# Patient Record
Sex: Female | Born: 1995 | Race: Black or African American | Hispanic: No | Marital: Single | Smoking: Never smoker
Health system: Southern US, Community
[De-identification: ages and names within clinical notes are randomized; demographics above are authoritative.]

## PROBLEM LIST (undated history)

## (undated) DIAGNOSIS — Z789 Other specified health status: Secondary | ICD-10-CM

## (undated) HISTORY — PX: NO PAST SURGERIES: SHX2092

---

## 2016-08-06 ENCOUNTER — Inpatient Hospital Stay (HOSPITAL_COMMUNITY)
Admission: AD | Admit: 2016-08-06 | Discharge: 2016-08-06 | Disposition: A | Discharge status: Home / Self Care | Payer: Managed Care, Other (non HMO) | Source: Ambulatory Visit | Attending: Obstetrics and Gynecology | Admitting: Obstetrics and Gynecology

## 2016-08-06 ENCOUNTER — Encounter (HOSPITAL_COMMUNITY): Payer: Self-pay

## 2016-08-06 DIAGNOSIS — Z975 Presence of (intrauterine) contraceptive device: Secondary | ICD-10-CM | POA: Diagnosis not present

## 2016-08-06 DIAGNOSIS — Z3202 Encounter for pregnancy test, result negative: Secondary | ICD-10-CM | POA: Diagnosis not present

## 2016-08-06 DIAGNOSIS — R109 Unspecified abdominal pain: Secondary | ICD-10-CM | POA: Diagnosis present

## 2016-08-06 DIAGNOSIS — R1030 Lower abdominal pain, unspecified: Secondary | ICD-10-CM

## 2016-08-06 HISTORY — DX: Other specified health status: Z78.9

## 2016-08-06 LAB — WET PREP, GENITAL
Sperm: NONE SEEN
TRICH WET PREP: NONE SEEN
Yeast Wet Prep HPF POC: NONE SEEN

## 2016-08-06 LAB — URINALYSIS, ROUTINE W REFLEX MICROSCOPIC
Bilirubin Urine: NEGATIVE
Glucose, UA: NEGATIVE mg/dL
Hgb urine dipstick: NEGATIVE
Ketones, ur: NEGATIVE mg/dL
LEUKOCYTES UA: NEGATIVE
Nitrite: NEGATIVE
PROTEIN: NEGATIVE mg/dL
SPECIFIC GRAVITY, URINE: 1.02 (ref 1.005–1.030)
pH: 6.5 (ref 5.0–8.0)

## 2016-08-06 LAB — POCT PREGNANCY, URINE: PREG TEST UR: NEGATIVE

## 2016-08-06 MED ORDER — METRONIDAZOLE 500 MG PO TABS: 500.0000 mg | ORAL_TABLET | Freq: Two times a day (BID) | ORAL | 0 refills | Status: AC

## 2016-08-06 NOTE — MAU Note (Signed)
Pt presents complaining of lower abdominal pain and vaginal bleeding that started tonight with clots. States she has a Mirena that was placed 2 years ago and missed her yearly appointment in September. Has had periods the last 3 months that are sometimes heavy. States it is not time for a period now. LMP 07/26/16. Last intercourse 3 days ago. Has not tried any medication for the pain.

## 2016-08-06 NOTE — Discharge Instructions (Signed)

## 2016-08-06 NOTE — MAU Provider Note (Signed)
MAU HISTORY AND PHYSICAL  Chief Complaint:  Abdominal Pain   Leslie Andreaniya Feely is a 20 y.o.  Not currently pregnant presenting for Abdominal Pain  Has had an IUD for past 2 years. For past 3 months has been having monthly light bleeding like a period. Tonight after using toilet she noticed "blood clots" that scared her and prompted her to come in. The material was dark brown. She also has some lower abdominal pain that is sharp and comes and goes. She rates it as a 5/10 at its worst. She does not have any abdominal pain tonight. She is sexually active with 1 partner, does not use protection, does not want STD testing at this time. Denies discharge.   Past Medical History:  Diagnosis Date  . Medical history non-contributory     Past Surgical History:  Procedure Laterality Date  . NO PAST SURGERIES      History reviewed. No pertinent family history.  Social History  Substance Use Topics  . Smoking status: Never Smoker  . Smokeless tobacco: Never Used  . Alcohol use No    No Known Allergies  No prescriptions prior to admission.   Review of Systems - Negative except for what is mentioned in HPI.  Physical Exam  Blood pressure 130/79, pulse 82, temperature 98 F (36.7 C), temperature source Oral, resp. rate 18, last menstrual period 07/26/2016. GENERAL: Well-developed, well-nourished female in no acute distress.  LUNGS: No respiratory distress HEART: Regular rate ABDOMEN: Soft, nontender, nondistended EXTREMITIES: Nontender, no edema, 2+ distal pulses. GU: IUD strings visible, moderate amount of yellow-white mucous present   Labs: Results for orders placed or performed during the hospital encounter of 08/06/16 (from the past 24 hour(s))  Urinalysis, Routine w reflex microscopic (not [facility])   Collection Time: 08/06/16  9:00 PM  Result Value Ref Range   Color, Urine YELLOW YELLOW   APPearance CLEAR CLEAR   Specific Gravity, Urine 1.020 1.005 - 1.030   pH 6.5 5.0 - 8.0   Glucose, UA NEGATIVE NEGATIVE mg/dL   Hgb urine dipstick NEGATIVE NEGATIVE   Bilirubin Urine NEGATIVE NEGATIVE   Ketones, ur NEGATIVE NEGATIVE mg/dL   Protein, ur NEGATIVE NEGATIVE mg/dL   Nitrite NEGATIVE NEGATIVE   Leukocytes, UA NEGATIVE NEGATIVE  Pregnancy, urine POC   Collection Time: 08/06/16  9:12 PM  Result Value Ref Range   Preg Test, Ur NEGATIVE NEGATIVE    Imaging Studies:  No results found.  Assessment: Leslie Andreaniya Minner is  20 y.o. No obstetric history on file. at Unknown presents with Abdominal Pain . MDM UA Pregnancy test Wet prep  Plan:  Lower abdominal pain and spotting with IUD in place- IUD Strings visible on speculum exam Wet prep positive for BV, prescription provided for Flagyl Outpatient US ordered to ascertain location of IUD Plan of care reviewed with patient, including labs and tests ordered and medical treatment.  Tillman SersAngela C Riccio, DO PGY-1 10/26/201710:43 PM  CNM attestation:  I have seen and examined this patient; I agree with above documentation in the Resident's note.   Leslie Andreaniya Heying is a 20 y.o. No obstetric history on file. reporting lower abdominal pain, vaginal spotting.  PE: BP 122/64   Pulse 77   Temp 98 F (36.7 C) (Oral)   Resp 18   LMP 07/26/2016 (Exact Date)  Gen: calm comfortable, NAD Resp: normal effort, no distress Abd: NT  ROS, labs, PMH reviewed   Plan: DC home US outpatient   Tawnya CrookHogan, Sheily Lineman Donovan, CNM 11:23 PM

## 2016-08-11 ENCOUNTER — Inpatient Hospital Stay (HOSPITAL_COMMUNITY)
Admission: AD | Admit: 2016-08-11 | Discharge: 2016-08-12 | Disposition: A | Discharge status: Home / Self Care | Payer: Managed Care, Other (non HMO) | Source: Ambulatory Visit | Attending: Family Medicine | Admitting: Family Medicine

## 2016-08-11 ENCOUNTER — Encounter (HOSPITAL_COMMUNITY): Payer: Self-pay

## 2016-08-11 ENCOUNTER — Inpatient Hospital Stay (HOSPITAL_COMMUNITY): Payer: Managed Care, Other (non HMO)

## 2016-08-11 DIAGNOSIS — R110 Nausea: Secondary | ICD-10-CM | POA: Diagnosis not present

## 2016-08-11 DIAGNOSIS — R1030 Lower abdominal pain, unspecified: Secondary | ICD-10-CM | POA: Insufficient documentation

## 2016-08-11 DIAGNOSIS — R109 Unspecified abdominal pain: Secondary | ICD-10-CM

## 2016-08-11 LAB — POCT PREGNANCY, URINE: Preg Test, Ur: NEGATIVE

## 2016-08-11 LAB — URINALYSIS, ROUTINE W REFLEX MICROSCOPIC
BILIRUBIN URINE: NEGATIVE
GLUCOSE, UA: NEGATIVE mg/dL
HGB URINE DIPSTICK: NEGATIVE
Ketones, ur: 15 mg/dL — AB
Leukocytes, UA: NEGATIVE
Nitrite: NEGATIVE
Protein, ur: NEGATIVE mg/dL
pH: 6 (ref 5.0–8.0)

## 2016-08-11 LAB — CBC
HEMATOCRIT: 40.2 % (ref 36.0–46.0)
HEMOGLOBIN: 13.7 g/dL (ref 12.0–15.0)
MCH: 30.4 pg (ref 26.0–34.0)
MCHC: 34.1 g/dL (ref 30.0–36.0)
MCV: 89.3 fL (ref 78.0–100.0)
Platelets: 261 10*3/uL (ref 150–400)
RBC: 4.5 MIL/uL (ref 3.87–5.11)
RDW: 12.1 % (ref 11.5–15.5)
WBC: 7.5 10*3/uL (ref 4.0–10.5)

## 2016-08-11 MED ORDER — KETOROLAC TROMETHAMINE 60 MG/2ML IM SOLN
60.0000 mg | Freq: Once | INTRAMUSCULAR | Status: AC
Start: 2016-08-11 — End: 2016-08-11
  Administered 2016-08-11: 60 mg via INTRAMUSCULAR
  Filled 2016-08-11: qty 2

## 2016-08-11 MED ORDER — IBUPROFEN 600 MG PO TABS: 600.0000 mg | ORAL_TABLET | Freq: Four times a day (QID) | ORAL | 0 refills | Status: AC | PRN

## 2016-08-11 MED ORDER — PROMETHAZINE HCL 25 MG PO TABS: 12.5000 mg | ORAL_TABLET | Freq: Four times a day (QID) | ORAL | 0 refills | Status: DC | PRN

## 2016-08-11 MED ORDER — ONDANSETRON 8 MG PO TBDP
8.0000 mg | ORAL_TABLET | Freq: Once | ORAL | Status: AC
  Administered 2016-08-11: 8 mg via ORAL
  Filled 2016-08-11: qty 1

## 2016-08-11 NOTE — MAU Note (Signed)
Pt reports she has been seen x 2 recently for abd pain and the pain is getting worse. Reports nausea and vomiting. Denies dysuria.

## 2016-08-11 NOTE — MAU Provider Note (Signed)
  History     CSN: [id number]  Arrival date and time: 08/11/16 2003   None     No chief complaint on file.  HPI Pt is 20 y.o.No obstetric history on file. Not pregnant with IUD in place for 3 months.  Pt was seen recently on 10/26 for evaluation of abdominal pain with outpatient ultrasound scheduled on 11/2.  Pt has been out of work for 1 week b/c of symptoms.  Pt wakes up in the mornings nauseated and has abd pain, mostly in the mornings.  Pt stays in bed most of the day because she feels weak.  Pt is taking Flagyl for BV.  Pt has not taken anything for the pain because what she gets over the counter does not work and she was not given a prescription for anything when she was last here. RN note:   [] Hide copied text [] Hover for attribution information Pt reports she has been seen x 2 recently for abd pain and the pain is getting worse. Reports nausea and vomiting. Denies dysuria.     Note from 08/06/2016 Jeanette Bryant is a 20 y.o.  Not currently pregnant presenting for Abdominal Pain  Has had an IUD for past 2 years. For past 3 months has been having monthly light bleeding like a period. Tonight after using toilet she noticed "blood clots" that scared her and prompted her to come in. The material was dark brown. She also has some lower abdominal pain that is sharp and comes and goes. She rates it as a 5/10 at its worst. She does not have any abdominal pain tonight. She is sexually active with 1 partner, does not use protection, does not want STD testing at this time. Denies discharge  Past Medical History:  Diagnosis Date  . Medical history non-contributory     Past Surgical History:  Procedure Laterality Date  . NO PAST SURGERIES      No family history on file.  Social History  Substance Use Topics  . Smoking status: Never Smoker  . Smokeless tobacco: Never Used  . Alcohol use No    Allergies: No Known Allergies  Prescriptions Prior to Admission  Medication Sig  Dispense Refill Last Dose  . metroNIDAZOLE (FLAGYL) 500 MG tablet Take 1 tablet (500 mg total) by mouth 2 (two) times daily. 14 tablet 0     Review of Systems  Constitutional: Positive for malaise/fatigue. Negative for chills and fever.  Gastrointestinal: Positive for abdominal pain, nausea and vomiting. Negative for constipation and diarrhea.  Neurological: Positive for weakness.   Physical Exam   Blood pressure 130/79, pulse 75, temperature 98.9 F (37.2 C), temperature source Oral, resp. rate 16, height 5\' 7"  (1.702 m), weight 226 lb (102.5 kg), last menstrual period 07/26/2016, SpO2 98 %.  Physical Exam  MAU Course  Procedures Toradol 60mg  IM for pain Zofran 8mg  ODT Urine testing for GC/Chlamydia US ordered Reviewed but care handed over to [facility] Leftwich-Kirby CNM before I evaluated her    Assessment and Plan    LINEBERRY,SUSAN 08/11/2016, 8:46 PM

## 2016-08-11 NOTE — Discharge Instructions (Signed)

## 2016-08-11 NOTE — MAU Provider Note (Signed)
Chief Complaint: No chief complaint on file.   None     SUBJECTIVE HPI: Jeanette Bryant is a 20 y.o. G0  Not pregnant who presents to maternity admissions reporting abdominal pain she was evaluated for on 10/26 persists and is associated with nausea without vomiting. The pain is intermittent, cramping pain in her low abdomen.  She reports onset of vaginal bleeding on the same day the pain started, 5 days ago. She has a Mirena IUD and does not usually have heavy bleeding so was worried something was wrong. Her vaginal bleeding resolved without treatment. She reports the pain is mostly in the mornings making her feel weak. She has missed work x 1 week because of her symptoms. She reports OTC medications are not working.   She denies vaginal itching/burning, urinary symptoms, h/a, dizziness, or fever/chills.     HPI  Past Medical History:  Diagnosis Date  . Medical history non-contributory    Past Surgical History:  Procedure Laterality Date  . NO PAST SURGERIES     Social History   Social History  . Marital status: Single    Spouse name: N/A  . Number of children: N/A  . Years of education: N/A   Occupational History  . Not on file.   Social History Main Topics  . Smoking status: Never Smoker  . Smokeless tobacco: Never Used  . Alcohol use No  . Drug use: No  . Sexual activity: Yes    Birth control/ protection: IUD   Other Topics Concern  . Not on file   Social History Narrative  . No narrative on file   No current facility-administered medications on file prior to encounter.    Current Outpatient Prescriptions on File Prior to Encounter  Medication Sig Dispense Refill  . metroNIDAZOLE (FLAGYL) 500 MG tablet Take 1 tablet (500 mg total) by mouth 2 (two) times daily. 14 tablet 0   No Known Allergies  ROS:  Review of Systems  Constitutional: Negative for chills, fatigue and fever.  Respiratory: Negative for shortness of breath.   Cardiovascular: Negative for chest  pain.  Gastrointestinal: Positive for abdominal pain.  Genitourinary: Positive for pelvic pain. Negative for difficulty urinating, dysuria, flank pain, vaginal bleeding, vaginal discharge and vaginal pain.  Neurological: Negative for dizziness and headaches.  Psychiatric/Behavioral: Negative.      I have reviewed patient's Past Medical Hx, Surgical Hx, Family Hx, Social Hx, medications and allergies.   Physical Exam   Patient Vitals for the past 24 hrs:  BP Temp Temp src Pulse Resp SpO2 Height Weight  08/11/16 2033 130/79 98.9 F (37.2 C) Oral 75 16 98 % 5\' 7"  (1.702 m) 226 lb (102.5 kg)   Constitutional: Well-developed, well-nourished female in no acute distress.  Cardiovascular: normal rate Respiratory: normal effort GI: Abd soft, non-tender. Pos BS x 4 MS: Extremities nontender, no edema, normal ROM Neurologic: Alert and oriented x 4.  GU: Neg CVAT.  PELVIC EXAM: Deferred  LAB RESULTS Results for orders placed or performed during the hospital encounter of 08/11/16 (from the past 24 hour(s))  Urinalysis, Routine w reflex microscopic (not at [facility])     Status: Abnormal   Collection Time: 08/11/16  8:41 PM  Result Value Ref Range   Color, Urine YELLOW YELLOW   APPearance CLEAR CLEAR   Specific Gravity, Urine >1.030 (H) 1.005 - 1.030   pH 6.0 5.0 - 8.0   Glucose, UA NEGATIVE NEGATIVE mg/dL   Hgb urine dipstick NEGATIVE NEGATIVE   Bilirubin  Urine NEGATIVE NEGATIVE   Ketones, ur 15 (A) NEGATIVE mg/dL   Protein, ur NEGATIVE NEGATIVE mg/dL   Nitrite NEGATIVE NEGATIVE   Leukocytes, UA NEGATIVE NEGATIVE  Pregnancy, urine POC     Status: None   Collection Time: 08/11/16  8:43 PM  Result Value Ref Range   Preg Test, Ur NEGATIVE NEGATIVE  CBC     Status: None   Collection Time: 08/11/16  8:50 PM  Result Value Ref Range   WBC 7.5 4.0 - 10.5 K/uL   RBC 4.50 3.87 - 5.11 MIL/uL   Hemoglobin 13.7 12.0 - 15.0 g/dL   HCT 46.940.2 62.936.0 - 52.846.0 %   MCV 89.3 78.0 - 100.0 fL   MCH 30.4  26.0 - 34.0 pg   MCHC 34.1 30.0 - 36.0 g/dL   RDW 41.312.1 24.411.5 - 01.015.5 %   Platelets 261 150 - 400 K/uL       IMAGING [place] Transvaginal Non-ob  Result Date: 08/11/2016 CLINICAL DATA:  Pelvic and abdominal pain for 6 days, IUD for 3 months EXAM: TRANSABDOMINAL AND TRANSVAGINAL ULTRASOUND OF PELVIS TECHNIQUE: Both transabdominal and transvaginal ultrasound examinations of the pelvis were performed. Transabdominal technique was performed for global imaging of the pelvis including uterus, ovaries, adnexal regions, and pelvic cul-de-sac. It was necessary to proceed with endovaginal exam following the transabdominal exam to visualize the uterus endometrium and ovaries. COMPARISON:  None FINDINGS: Uterus Measurements: 6.9 x 3.2 x 4.4 cm. No fibroids or other mass visualized. IUD is within the lower body to lower uterine segment. Endometrium Thickness: 1.2 mm.  No focal abnormality visualized. Right ovary Measurements: 3.3 x 2.5 x 2.3 cm. Normal appearance/no adnexal mass Left ovary Measurements: 3.3 x 1.3 x 1.6 cm. Normal appearance/no adnexal mass. Other findings Small amount of anechoic free pelvic fluid IMPRESSION: 1. Mildly low-lying intrauterine device which appears position within the mid to lower uterine body and lower uterine segment. 2. Small amount of free pelvic fluid. 3. Otherwise unremarkable pelvic ultrasound Electronically Signed   By: Jasmine PangKim  Fujinaga M.D.   On: 08/11/2016 22:08   [place] Pelvis Complete  Result Date: 08/11/2016 CLINICAL DATA:  Pelvic and abdominal pain for 6 days, IUD for 3 months EXAM: TRANSABDOMINAL AND TRANSVAGINAL ULTRASOUND OF PELVIS TECHNIQUE: Both transabdominal and transvaginal ultrasound examinations of the pelvis were performed. Transabdominal technique was performed for global imaging of the pelvis including uterus, ovaries, adnexal regions, and pelvic cul-de-sac. It was necessary to proceed with endovaginal exam following the transabdominal exam to visualize the uterus  endometrium and ovaries. COMPARISON:  None FINDINGS: Uterus Measurements: 6.9 x 3.2 x 4.4 cm. No fibroids or other mass visualized. IUD is within the lower body to lower uterine segment. Endometrium Thickness: 1.2 mm.  No focal abnormality visualized. Right ovary Measurements: 3.3 x 2.5 x 2.3 cm. Normal appearance/no adnexal mass Left ovary Measurements: 3.3 x 1.3 x 1.6 cm. Normal appearance/no adnexal mass. Other findings Small amount of anechoic free pelvic fluid IMPRESSION: 1. Mildly low-lying intrauterine device which appears position within the mid to lower uterine body and lower uterine segment. 2. Small amount of free pelvic fluid. 3. Otherwise unremarkable pelvic ultrasound Electronically Signed   By: Jasmine PangKim  Fujinaga M.D.   On: 08/11/2016 22:08    MAU Management/MDM: Ordered labs and reviewed results.  IUD in place, no evidence of acute abdomen or infection.  Reassurance provided to pt. [facility]  Encouraged pt to keep appt. Return to MAU for emergencies.  Rx for ibuprofen and Phenergan PO.  Pt stable at time of discharge.  ASSESSMENT 1. Abdominal pain in female patient   2. Abdominal pain   3. Nausea without vomiting     PLAN Discharge home   Medication List    TAKE these medications   ibuprofen 600 MG tablet Commonly known as:  ADVIL,MOTRIN Take 1 tablet (600 mg total) by mouth every 6 (six) hours as needed.   metroNIDAZOLE 500 MG tablet Commonly known as:  FLAGYL Take 1 tablet (500 mg total) by mouth 2 (two) times daily.   promethazine 25 MG tablet Commonly known as:  PHENERGAN Take 0.5-1 tablets (12.5-25 mg total) by mouth every 6 (six) hours as needed for nausea.      Follow-up Information    [facility] .   Specialty:  Obstetrics and Gynecology Why:  Or Gyn provider of your choice if symptoms persist. Return to MAU as needed for emergencies. Contact information: [address] [place] [address] [phone]          Sharen Counter Certified Nurse-Midwife 08/12/2016  12:00 AM

## 2016-08-12 LAB — HIV ANTIBODY (ROUTINE TESTING W REFLEX): HIV Screen 4th Generation wRfx: NONREACTIVE

## 2016-08-20 ENCOUNTER — Encounter (HOSPITAL_COMMUNITY): Payer: Self-pay | Admitting: Emergency Medicine

## 2016-08-20 ENCOUNTER — Emergency Department (HOSPITAL_COMMUNITY)
Admission: EM | Admit: 2016-08-20 | Discharge: 2016-08-20 | Disposition: A | Discharge status: Home / Self Care | Payer: Managed Care, Other (non HMO) | Attending: Emergency Medicine | Admitting: Emergency Medicine

## 2016-08-20 DIAGNOSIS — E860 Dehydration: Secondary | ICD-10-CM | POA: Diagnosis not present

## 2016-08-20 DIAGNOSIS — R112 Nausea with vomiting, unspecified: Secondary | ICD-10-CM | POA: Diagnosis not present

## 2016-08-20 DIAGNOSIS — R52 Pain, unspecified: Secondary | ICD-10-CM | POA: Diagnosis present

## 2016-08-20 LAB — CBC
HEMATOCRIT: 40.7 % (ref 36.0–46.0)
Hemoglobin: 13.6 g/dL (ref 12.0–15.0)
MCH: 30 pg (ref 26.0–34.0)
MCHC: 33.4 g/dL (ref 30.0–36.0)
MCV: 89.8 fL (ref 78.0–100.0)
PLATELETS: 271 10*3/uL (ref 150–400)
RBC: 4.53 MIL/uL (ref 3.87–5.11)
RDW: 11.9 % (ref 11.5–15.5)
WBC: 7.6 10*3/uL (ref 4.0–10.5)

## 2016-08-20 LAB — COMPREHENSIVE METABOLIC PANEL
ALBUMIN: 4.3 g/dL (ref 3.5–5.0)
ALT: 18 U/L (ref 14–54)
AST: 17 U/L (ref 15–41)
Alkaline Phosphatase: 47 U/L (ref 38–126)
Anion gap: 10 (ref 5–15)
BUN: 8 mg/dL (ref 6–20)
CO2: 24 mmol/L (ref 22–32)
CREATININE: 0.77 mg/dL (ref 0.44–1.00)
Calcium: 9.5 mg/dL (ref 8.9–10.3)
Chloride: 105 mmol/L (ref 101–111)
GFR calc Af Amer: >60 mL/min (ref >60)
GFR calc non Af Amer: >60 mL/min (ref >60)
GLUCOSE: 98 mg/dL (ref 65–99)
POTASSIUM: 4 mmol/L (ref 3.5–5.1)
SODIUM: 139 mmol/L (ref 135–145)
Total Bilirubin: 1 mg/dL (ref 0.3–1.2)
Total Protein: 7.8 g/dL (ref 6.5–8.1)

## 2016-08-20 LAB — URINALYSIS, ROUTINE W REFLEX MICROSCOPIC
GLUCOSE, UA: NEGATIVE mg/dL
HGB URINE DIPSTICK: NEGATIVE
KETONES UR: 40 mg/dL — AB
Leukocytes, UA: NEGATIVE
Nitrite: NEGATIVE
PH: 6 (ref 5.0–8.0)
PROTEIN: NEGATIVE mg/dL
Specific Gravity, Urine: 1.031 — ABNORMAL HIGH (ref 1.005–1.030)

## 2016-08-20 LAB — I-STAT BETA HCG BLOOD, ED (MC, WL, AP ONLY): I-stat hCG, quantitative: <5 m[IU]/mL (ref <5)

## 2016-08-20 LAB — LIPASE, BLOOD: LIPASE: 31 U/L (ref 11–51)

## 2016-08-20 MED ORDER — ONDANSETRON HCL 4 MG PO TABS: 4.0000 mg | ORAL_TABLET | Freq: Four times a day (QID) | ORAL | 0 refills | Status: DC

## 2016-08-20 MED ORDER — ONDANSETRON HCL 4 MG/2ML IJ SOLN
4.0000 mg | Freq: Once | INTRAMUSCULAR | Status: AC
  Administered 2016-08-20: 4 mg via INTRAVENOUS
  Filled 2016-08-20: qty 2

## 2016-08-20 MED ORDER — RANITIDINE HCL 150 MG PO CAPS: 150.0000 mg | ORAL_CAPSULE | Freq: Every day | ORAL | 0 refills | Status: DC

## 2016-08-20 MED ORDER — KETOROLAC TROMETHAMINE 30 MG/ML IJ SOLN
30.0000 mg | Freq: Once | INTRAMUSCULAR | Status: AC
  Administered 2016-08-20: 30 mg via INTRAVENOUS
  Filled 2016-08-20: qty 1

## 2016-08-20 MED ORDER — SODIUM CHLORIDE 0.9 % IV BOLUS (SEPSIS)
1000.0000 mL | Freq: Once | INTRAVENOUS | Status: AC
  Administered 2016-08-20: 1000 mL via INTRAVENOUS

## 2016-08-20 NOTE — ED Triage Notes (Signed)
Pt from home with c/o emesis and weakness since her birthday.  Pt denies diarrhea.  Pt additionally reports dizziness.  NAD, A&O.

## 2016-08-20 NOTE — ED Notes (Signed)
NAD. Pt ambulatory at discharge with no questions. Pt. Verbalized need to follow up with [facility].

## 2016-08-20 NOTE — ED Provider Notes (Signed)
[facility]-EM[facility]ENCY DEPT Provider Note   CSN: [id number] Arrival date & time: 08/20/16  1203     History   Chief Complaint Chief Complaint  Patient presents with  . Emesis  . Generalized Body Aches    HPI Jeanette Bryant is a 20 y.o. female.  Patient presents with two weeks of generalized body aches, fatigue, poor appetite, and vomiting after eating. Initially presented with lower abdominal pain but this has now rseolved. Saw her gynecologist who did a pelvic ultrasound which was unremarkable. Denies recent fevers, sick contacts, recent travel. Denies diarrhea or constipation. No known family history of any similar illnesses.   The history is provided by the patient and medical records. No language interpreter was used.  Emesis   This is a new problem. The current episode started more than 1 week ago. The problem occurs 2 to 4 times per day. The problem has not changed since onset.The emesis has an appearance of stomach contents. There has been no fever. Associated symptoms include abdominal pain and myalgias. Pertinent negatives include no diarrhea, no fever and no sweats.    Past Medical History:  Diagnosis Date  . Medical history non-contributory     There are no active problems to display for this patient.   Past Surgical History:  Procedure Laterality Date  . NO PAST SURGERIES      OB History    No data available       Home Medications    Prior to Admission medications   Medication Sig Start Date End Date Taking? Authorizing Provider  ibuprofen (ADVIL,MOTRIN) 600 MG tablet Take 1 tablet (600 mg total) by mouth every 6 (six) hours as needed. Patient not taking: Reported on 08/20/2016 08/11/16   Misty StanleyLisa A Leftwich-Kirby, CNM  ondansetron (ZOFRAN) 4 MG tablet Take 1 tablet (4 mg total) by mouth every 6 (six) hours. 08/20/16   Preston FleetingAnna Donovyn Guidice, MD  promethazine (PHENERGAN) 25 MG tablet Take 0.5-1 tablets (12.5-25 mg total) by mouth every 6 (six) hours as needed for  nausea. Patient not taking: Reported on 08/20/2016 08/11/16   Misty StanleyLisa A Leftwich-Kirby, CNM  ranitidine (ZANTAC) 150 MG capsule Take 1 capsule (150 mg total) by mouth daily. 08/20/16   Preston FleetingAnna Idaly Verret, MD    Family History History reviewed. No pertinent family history.  Social History Social History  Substance Use Topics  . Smoking status: Never Smoker  . Smokeless tobacco: Never Used  . Alcohol use No     Allergies   Patient has no known allergies.   Review of Systems Review of Systems  Constitutional: Positive for appetite change and fatigue. Negative for fever.  HENT: Negative.   Respiratory: Negative.   Cardiovascular: Negative.   Gastrointestinal: Positive for abdominal pain and vomiting. Negative for diarrhea.  Genitourinary: Positive for decreased urine volume.  Musculoskeletal: Positive for myalgias.  Skin: Negative.   Allergic/Immunologic: Negative for immunocompromised state.  Neurological: Negative.   Hematological: Does not bruise/bleed easily.  Psychiatric/Behavioral: Negative.      Physical Exam Updated Vital Signs BP 115/66 (BP Location: Right Arm)   Pulse 92   Temp 98.3 F (36.8 C) (Oral)   Resp 19   Wt 99.8 kg   LMP 07/26/2016 (Exact Date)   SpO2 99%   BMI 34.46 kg/m   Physical Exam  Constitutional: She is oriented to person, place, and time. She appears well-developed and well-nourished. No distress.  HENT:  Head: Normocephalic and atraumatic.  Eyes: Conjunctivae and EOM are normal. Pupils are equal, round, and  reactive to light. No scleral icterus.  Neck: Normal range of motion. Neck supple.  Cardiovascular: Normal rate, regular rhythm, normal heart sounds and intact distal pulses.  Exam reveals no gallop and no friction rub.   No murmur heard. Pulmonary/Chest: Effort normal and breath sounds normal. No respiratory distress. She has no wheezes. She has no rales.  Abdominal: Soft. She exhibits no distension. There is no tenderness. There is no  rebound and no guarding.  Musculoskeletal: She exhibits no edema.  Neurological: She is alert and oriented to person, place, and time.  Skin: Skin is warm and dry. She is not diaphoretic.  Psychiatric: She has a normal mood and affect. Her behavior is normal. Judgment and thought content normal.     ED Treatments / Results  Labs (all labs ordered are listed, but only abnormal results are displayed) Labs Reviewed  URINALYSIS, ROUTINE W REFLEX MICROSCOPIC (NOT AT [facility]) - Abnormal; Notable for the following:       Result Value   Color, Urine AMBER (*)    Specific Gravity, Urine 1.031 (*)    Bilirubin Urine SMALL (*)    Ketones, ur 40 (*)    All other components within normal limits  LIPASE, BLOOD  COMPREHENSIVE METABOLIC PANEL  CBC  I-STAT BETA HCG BLOOD, ED ([facility], [facility], [facility] ONLY)    EKG  EKG Interpretation None       Radiology No results found.  Procedures Procedures (including critical care time)  Medications Ordered in ED Medications  sodium chloride 0.9 % bolus 1,000 mL (0 mLs Intravenous Stopped 08/20/16 1858)  ondansetron (ZOFRAN) injection 4 mg (4 mg Intravenous Given 08/20/16 1643)  ketorolac (TORADOL) 30 MG/ML injection 30 mg (30 mg Intravenous Given 08/20/16 1643)     Initial Impression / Assessment and Plan / ED Course  I have reviewed the triage vital signs and the nursing notes.  Pertinent labs & imaging results that were available during my care of the patient were reviewed by me and considered in my medical decision making (see chart for details).  Clinical Course     Patient presents with two weeks of generalized body aches, vomiting after eating, intermittent abdominal pain. She has been seen by gynecology as it started with pelvic pain and she had a normal pelvic ultrasound. Now has persistent poor appetite and general fatigue. Do not suspect infection given no known source or contacts, no fever, normal white count. Unlikely to be flu. No known tick bite  exposure. Abdomen is soft and non-tender. Labs are reassuring with normal renal function, electrolytes, and hemoglobin. Pregnancy test is negative. UA suggests mild dehydration without signs of UTI or pyelonephritis. Symptoms may be exacerbated by gastritis so she will be prescribed Zantac and Zofran. I do not feel that the patient has any emergent pathology causing her symptoms and emphasized that she will need to follow up with PCP for further evaluation if symptoms continue. [facility] was [facility]. She was given return precautions for worsening symptoms and expressed understanding of these. She is in good condition for discharge home.  Final Clinical Impressions(s) / ED Diagnoses   Final diagnoses:  Dehydration  Non-intractable vomiting with nausea, unspecified vomiting type    New Prescriptions Discharge Medication List as of 08/20/2016  6:07 PM    START taking these medications   Details  ondansetron (ZOFRAN) 4 MG tablet Take 1 tablet (4 mg total) by mouth every 6 (six) hours., Starting Thu 08/20/2016, Print  ranitidine (ZANTAC) 150 MG capsule Take 1 capsule (150 mg total) by mouth daily., Starting Thu 08/20/2016, Print         Preston FleetingAnna Ismahan Lippman, MD 08/21/16 [id number]    Gwyneth SproutWhitney Plunkett, MD 08/21/16 1800

## 2016-08-20 NOTE — ED Notes (Signed)
Per PA pt. Fluids need to be completed before discharge. 150 mL remaining.

## 2017-07-04 ENCOUNTER — Emergency Department (HOSPITAL_COMMUNITY)
Admission: EM | Admit: 2017-07-04 | Discharge: 2017-07-04 | Discharge status: Against Medical Advice | Payer: Managed Care, Other (non HMO)

## 2017-07-04 NOTE — ED Notes (Signed)
Pt called from lobby with no response. 

## 2017-07-04 NOTE — ED Notes (Signed)
Pt called from the lobby with no response x3 

## 2018-12-05 ENCOUNTER — Other Ambulatory Visit: Payer: Self-pay

## 2018-12-05 ENCOUNTER — Encounter (HOSPITAL_BASED_OUTPATIENT_CLINIC_OR_DEPARTMENT_OTHER): Payer: Self-pay | Admitting: *Deleted

## 2018-12-05 ENCOUNTER — Emergency Department (HOSPITAL_BASED_OUTPATIENT_CLINIC_OR_DEPARTMENT_OTHER): Payer: No Typology Code available for payment source

## 2018-12-05 ENCOUNTER — Emergency Department (HOSPITAL_BASED_OUTPATIENT_CLINIC_OR_DEPARTMENT_OTHER)
Admission: EM | Admit: 2018-12-05 | Discharge: 2018-12-05 | Disposition: A | Discharge status: Home / Self Care | Payer: No Typology Code available for payment source | Attending: Emergency Medicine | Admitting: Emergency Medicine

## 2018-12-05 DIAGNOSIS — R079 Chest pain, unspecified: Secondary | ICD-10-CM | POA: Diagnosis present

## 2018-12-05 DIAGNOSIS — Z79899 Other long term (current) drug therapy: Secondary | ICD-10-CM | POA: Insufficient documentation

## 2018-12-05 DIAGNOSIS — R42 Dizziness and giddiness: Secondary | ICD-10-CM | POA: Insufficient documentation

## 2018-12-05 DIAGNOSIS — R110 Nausea: Secondary | ICD-10-CM | POA: Insufficient documentation

## 2018-12-05 LAB — BASIC METABOLIC PANEL
Anion gap: 5 (ref 5–15)
BUN: 13 mg/dL (ref 6–20)
CALCIUM: 8.9 mg/dL (ref 8.9–10.3)
CO2: 26 mmol/L (ref 22–32)
CREATININE: 0.68 mg/dL (ref 0.44–1.00)
Chloride: 106 mmol/L (ref 98–111)
GFR calc Af Amer: >60 mL/min (ref >60)
GFR calc non Af Amer: >60 mL/min (ref >60)
GLUCOSE: 92 mg/dL (ref 70–99)
Potassium: 4.1 mmol/L (ref 3.5–5.1)
Sodium: 137 mmol/L (ref 135–145)

## 2018-12-05 LAB — CBC WITH DIFFERENTIAL/PLATELET
Abs Immature Granulocytes: 0.01 10*3/uL (ref 0.00–0.07)
BASOS ABS: 0 10*3/uL (ref 0.0–0.1)
Basophils Relative: 0 %
EOS ABS: 0.1 10*3/uL (ref 0.0–0.5)
EOS PCT: 1 %
HEMATOCRIT: 40.6 % (ref 36.0–46.0)
Hemoglobin: 12.8 g/dL (ref 12.0–15.0)
Immature Granulocytes: 0 %
LYMPHS ABS: 2 10*3/uL (ref 0.7–4.0)
Lymphocytes Relative: 29 %
MCH: 30.4 pg (ref 26.0–34.0)
MCHC: 31.5 g/dL (ref 30.0–36.0)
MCV: 96.4 fL (ref 80.0–100.0)
Monocytes Absolute: 0.5 10*3/uL (ref 0.1–1.0)
Monocytes Relative: 7 %
NRBC: 0 % (ref 0.0–0.2)
Neutro Abs: 4.3 10*3/uL (ref 1.7–7.7)
Neutrophils Relative %: 63 %
Platelets: 262 10*3/uL (ref 150–400)
RBC: 4.21 MIL/uL (ref 3.87–5.11)
RDW: 12.3 % (ref 11.5–15.5)
WBC: 6.9 10*3/uL (ref 4.0–10.5)

## 2018-12-05 LAB — TROPONIN I

## 2018-12-05 LAB — PREGNANCY, URINE: Preg Test, Ur: NEGATIVE

## 2018-12-05 MED ORDER — SODIUM CHLORIDE 0.9 % IV BOLUS
1000.0000 mL | Freq: Once | INTRAVENOUS | Status: AC
Start: 2018-12-05 — End: 2018-12-05
  Administered 2018-12-05: 1000 mL via INTRAVENOUS

## 2018-12-05 MED ORDER — ONDANSETRON HCL 4 MG/2ML IJ SOLN
4.0000 mg | Freq: Once | INTRAMUSCULAR | Status: AC
  Administered 2018-12-05: 4 mg via INTRAVENOUS
  Filled 2018-12-05: qty 2

## 2018-12-05 NOTE — ED Provider Notes (Signed)
[facility] EMERGENCY DEPARTMENT Provider Note   CSN: [id number] Arrival date & time: 12/05/18  1020    History   Chief Complaint Chief Complaint  Patient presents with  . Chest Pain    HPI Jeanette Bryant is a 23 y.o. female otherwise healthy female presenting to the emergency department today with chief complaint of chest pain.  Patient states the pain started last night while she was talking on the phone.  She describes the pain as pressure and it is located in the center of her chest radiating to both sides.  She rates the pain 6 out of 10 in severity.  After the pain started the patient got in bed x3 hours with chest pain before falling asleep.  Patient states when she woke up this morning the chest pain was gone but returned while she was driving to work.  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 Patient reports associated nausea and dizziness. She did not take any medications prior to arrival.   Patient denies family history of cardiac disease.  Also denies shortness of breath, abdominal pain, back pain, palpitations, radiation to left/right arm, diaphoresis, syncope, recent surgery, calf pain. History provided by patient.     Past Medical History:  Diagnosis Date  . Medical history non-contributory     There are no active problems to display for this patient.   Past Surgical History:  Procedure Laterality Date  . NO PAST SURGERIES       OB History   No obstetric history on file.      Home Medications    Prior to Admission medications   Medication Sig Start Date End Date Taking? Authorizing Provider  ibuprofen (ADVIL,MOTRIN) 600 MG tablet Take 1 tablet (600 mg total) by mouth every 6 (six) hours as needed. Patient not taking: Reported on 08/20/2016 08/11/16   Leftwich-Kirby, Misty Stanley A, CNM  ondansetron (ZOFRAN) 4 MG tablet Take 1 tablet (4 mg total) by mouth every 6 (six) hours. 08/20/16   Preston Fleeting, MD    promethazine (PHENERGAN) 25 MG tablet Take 0.5-1 tablets (12.5-25 mg total) by mouth every 6 (six) hours as needed for nausea. Patient not taking: Reported on 08/20/2016 08/11/16   Leftwich-Kirby, Wilmer Floor, CNM  ranitidine (ZANTAC) 150 MG capsule Take 1 capsule (150 mg total) by mouth daily. 08/20/16   Preston Fleeting, MD    Family History History reviewed. No pertinent family history.  Social History Social History   Tobacco Use  . Smoking status: Never Smoker  . Smokeless tobacco: Never Used  Substance Use Topics  . Alcohol use: No  . Drug use: No     Allergies   Patient has no known allergies.   Review of Systems Review of Systems  Cardiovascular: Positive for chest pain.  Neurological: Positive for dizziness.  All other systems reviewed and are negative.    Physical Exam Updated Vital Signs BP 115/72   Pulse 70   Resp (!) 22   Ht 5\' 8"  (1.727 m)   Wt 104.3 kg   SpO2 100%   BMI 34.97 kg/m   Physical Exam Vitals signs and nursing note reviewed.  Constitutional:      Appearance: She is not ill-appearing or toxic-appearing.  HENT:     Head: Normocephalic and atraumatic.     Nose: Nose normal.     Mouth/Throat:     Mouth: Mucous membranes are moist.     Pharynx:  Oropharynx is clear.  Eyes:     Conjunctiva/sclera: Conjunctivae normal.  Neck:     Musculoskeletal: Normal range of motion.     Vascular: No JVD.  Cardiovascular:     Rate and Rhythm: Normal rate and regular rhythm.     Pulses: Normal pulses.          Radial pulses are 2+ on the right side and 2+ on the left side.     Heart sounds: Normal heart sounds.  Pulmonary:     Effort: Pulmonary effort is normal.     Breath sounds: Normal breath sounds.  Abdominal:     General: There is no distension.     Palpations: Abdomen is soft.     Tenderness: There is no abdominal tenderness. There is no guarding or rebound.  Musculoskeletal: Normal range of motion.  Skin:    General: Skin is warm and dry.      Capillary Refill: Capillary refill takes less than 2 seconds.  Neurological:     Mental Status: She is alert. Mental status is at baseline.     Motor: No weakness.  Psychiatric:        Behavior: Behavior normal.      ED Treatments / Results  Labs (all labs ordered are listed, but only abnormal results are displayed) Labs Reviewed  TROPONIN I  CBC WITH DIFFERENTIAL/PLATELET  BASIC METABOLIC PANEL  PREGNANCY, URINE    EKG EKG Interpretation  Date/Time:  Monday December 05 2018 10:33:21 EST Ventricular Rate:  64 PR Interval:    QRS Duration: 99 QT Interval:  388 QTC Calculation: 401 R Axis:   63 Text Interpretation:  Sinus rhythm Borderline Q waves in inferior leads Baseline wander in lead(s) V1 No previous tracing Confirmed by Gwyneth Sprout ([id number]) on 12/05/2018 10:51:23 AM   Radiology Dg Chest 2 View  Result Date: 12/05/2018 CLINICAL DATA:  Chest pain EXAM: CHEST - 2 VIEW COMPARISON:  None. FINDINGS: The heart size and mediastinal contours are within normal limits. Both lungs are clear. The visualized skeletal structures are unremarkable. IMPRESSION: No active cardiopulmonary disease. Electronically Signed   By: Marlan Palau M.D.   On: 12/05/2018 11:14    Procedures Procedures (including critical care time)  Medications Ordered in ED Medications  ondansetron (ZOFRAN) injection 4 mg (4 mg Intravenous Given 12/05/18 1112)  sodium chloride 0.9 % bolus 1,000 mL (0 mLs Intravenous Stopped 12/05/18 1312)     Initial Impression / Assessment and Plan / ED Course  I have reviewed the triage vital signs and the nursing notes.  Pertinent labs & imaging results that were available during my care of the patient were reviewed by me and considered in my medical decision making (see chart for details).    Patient is to be discharged with recommendation to follow up with PCP in regards to today's hospital visit.  Low risk heart score of 1. Chest pain is not likely of cardiac  or pulmonary etiology d/t presentation, PERC negative, VSS, no tracheal deviation, no JVD or new murmur, RRR, breath sounds equal bilaterally, EKG without acute abnormalities, negative troponin, and negative CXR. Her chest pain might be associated with GERD.  Patient states the pain started this morning after eating a chicken biscuit for breakfast she describes the pain as in the center of her chest radiating to both sides.  Recommend patient take Tums if similar pain returns. Discussed at length healthy lifestyle choices including DASH diet and exercising.  Her vitals today are reassuring, she  is not hypoxic and does not have elevated blood pressure. I checked pt's temperature and she is afebrile at 98.0 oral. Pt has been advised to return to the ED if CP becomes exertional, associated with diaphoresis or nausea, radiates to left jaw/arm, worsens or becomes concerning in any way. Pt appears reliable for follow up and is agreeable to discharge. Case has been discussed with Dr. Anitra Lauth who agrees with the above plan to discharge.       Final Clinical Impressions(s) / ED Diagnoses   Final diagnoses:  Nonspecific chest pain    ED Discharge Orders    None       Kathyrn Lass 12/05/18 2249    Gwyneth Sprout, MD 12/07/18 [phone]

## 2018-12-05 NOTE — Discharge Instructions (Signed)
Read instructions below for reasons to return to the Emergency Department. It is recommended that your follow up with your Primary Care Doctor in regards to today's visit. If you do not have a doctor, use the resource guide to help you find one.   Included information about the DASH diet in your discharge paperwork.  This is an option that includes healthy eating choices and tips.  Tests performed today include: An EKG of your heart A chest x-ray Cardiac enzymes - a blood test for heart muscle damage Blood counts and electrolytes  Chest Pain (Nonspecific)  HOME CARE INSTRUCTIONS       -You can take Tums if you have symptoms of heartburn or acid reflux.  Typically relief happens within 5 to 10 minutes after taking it. -For the next few days, avoid physical activities that bring on chest pain. Continue physical activities as directed.  -Follow your caregiver's suggestions for further testing if your chest pain does not go away. \ -Follow-up with your primary care provider within 1 week.   SEEK MEDICAL CARE IF:  You think you are having problems from the medicine you are taking. Read your medicine instructions carefully.  Your chest pain does not go away, even after treatment.  You develop a rash with blisters on your chest.   SEEK IMMEDIATE MEDICAL CARE IF:  You have increased chest pain or pain that spreads to your arm, neck, jaw, back, or belly (abdomen).  You develop shortness of breath, an increasing cough, or you are coughing up blood.  You have severe back or abdominal pain, feel sick to your stomach (nauseous) or throw up (vomit).  You develop severe weakness, fainting, or chills.  You have an oral temperature above 102 F (38.9 C), not controlled by medicine.   THIS IS AN EMERGENCY. Do not wait to see if the pain will go away. Get medical help at once. Call 911. Do not drive yourself to the hospital.

## 2018-12-05 NOTE — ED Triage Notes (Signed)
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 She has had cp since last night. "felt like chest was caving in" and she got dizzy.

## 2018-12-05 NOTE — ED Notes (Signed)
NAD at this time. Pt is stable and going home.  

## 2019-09-15 ENCOUNTER — Other Ambulatory Visit: Payer: Self-pay

## 2019-09-15 ENCOUNTER — Inpatient Hospital Stay (HOSPITAL_COMMUNITY)
Admission: AD | Admit: 2019-09-15 | Discharge: 2019-09-15 | Disposition: A | Discharge status: Home / Self Care | Payer: No Typology Code available for payment source | Attending: Obstetrics & Gynecology | Admitting: Obstetrics & Gynecology

## 2019-09-15 DIAGNOSIS — R109 Unspecified abdominal pain: Secondary | ICD-10-CM | POA: Insufficient documentation

## 2019-09-15 DIAGNOSIS — Z975 Presence of (intrauterine) contraceptive device: Secondary | ICD-10-CM | POA: Insufficient documentation

## 2019-09-15 DIAGNOSIS — Z3202 Encounter for pregnancy test, result negative: Secondary | ICD-10-CM | POA: Diagnosis not present

## 2019-09-15 LAB — POCT PREGNANCY, URINE: Preg Test, Ur: NEGATIVE

## 2019-09-15 NOTE — MAU Note (Signed)
.   Jeanette Bryant is a 23 y.o. at Unknown here in MAU reporting: she had her IUD replaced 2 weeks ago and she started having pain since she had it put in nut the pain has gotten worse over the last few days LMP: unsure Onset of complaint: ongoing Pain score: 8 Vitals:   09/15/19 1711  BP: 122/62  Pulse: 77  Resp: 16  Temp: 98.1 F (36.7 C)  SpO2: 100%     FHT: Lab orders placed from triage: UPT

## 2019-09-15 NOTE — MAU Provider Note (Signed)
S Ms. Jeanette Bryant is a 23 y.o. No obstetric history on file. female who presents to MAU today with complaint of LAP and recent IUD placed.   O BP 122/62   Pulse 77   Temp 98.1 F (36.7 C)   Resp 16   Ht 5\' 7"  (1.702 m)   Wt 110.2 kg   SpO2 100%   BMI 38.06 kg/m  Physical Exam  Nursing note and vitals reviewed. Constitutional: She is oriented to person, place, and time. She appears well-developed and well-nourished.  HENT:  Head: Normocephalic and atraumatic.  Neck: Normal range of motion.  Cardiovascular: Normal rate.  Respiratory: Effort normal. No respiratory distress.  Musculoskeletal: Normal range of motion.  Neurological: She is alert and oriented to person, place, and time.  Psychiatric: She has a normal mood and affect.  UPT- neg  A Non pregnant female Medical screening exam complete  P Discharge from MAU in stable condition Patient given the option of transfer to [facility] for further evaluation or seek care in outpatient facility of choice- she prefers to go home and try Ibuprofen, will go to UC if pain persists List of options for follow-up given  Warning signs for worsening condition that would warrant emergency follow-up discussed Patient may return to MAU as needed for pregnancy related complaints  Julianne Handler, CNM 09/15/2019 5:29 PM

## 2020-02-05 IMAGING — CR DG CHEST 2V
2 series · 2 of 2 positions shown · non-contrast
Comparison: None.

CLINICAL DATA: Chest pain

EXAM:
CHEST - 2 VIEW

[w chest pa]
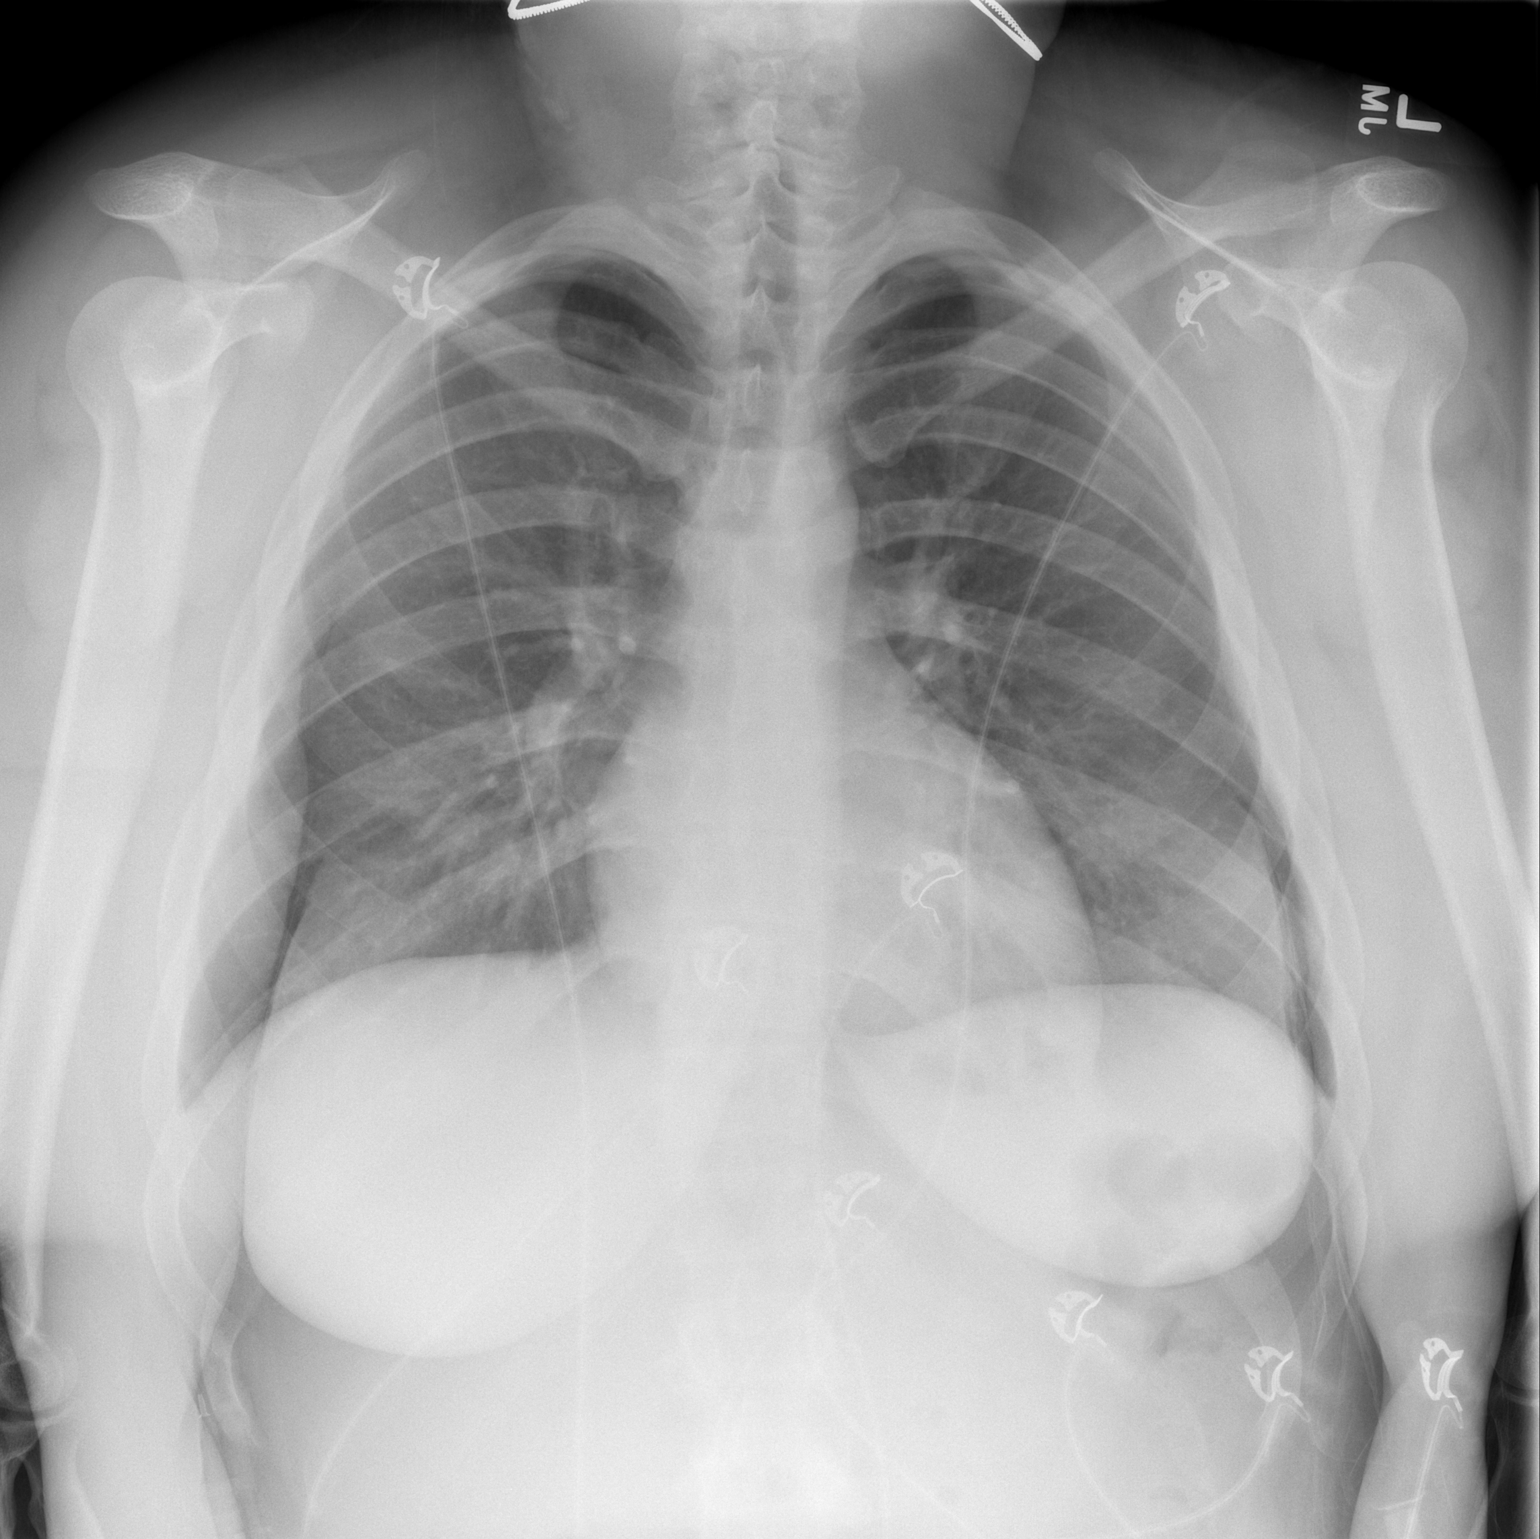

[w chest lat]
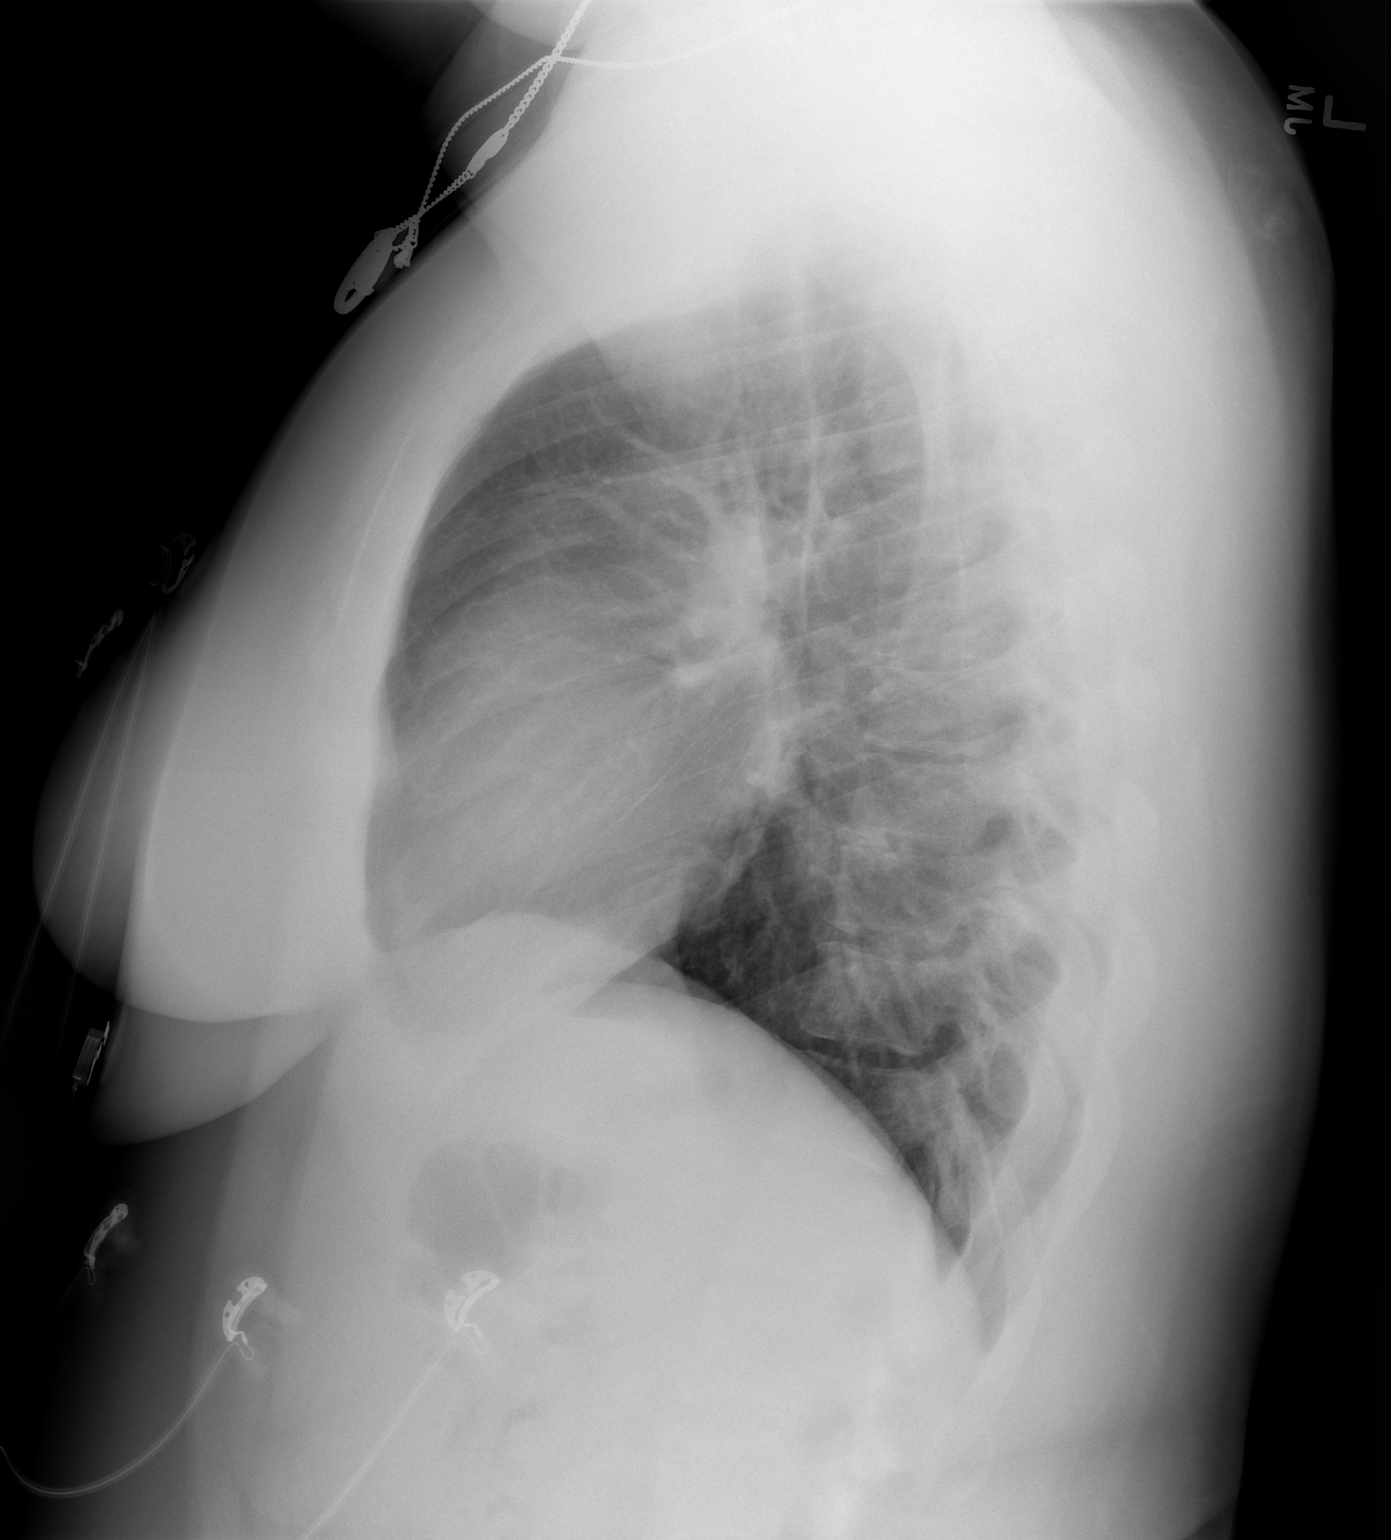

[2 of 2 positions shown; findings below may reference images not displayed]

FINDINGS: The heart size and mediastinal contours are within normal limits.
Both lungs are clear. The visualized skeletal structures are
unremarkable.
IMPRESSION: No active cardiopulmonary disease.

## 2020-09-01 ENCOUNTER — Other Ambulatory Visit: Payer: Self-pay

## 2020-09-01 ENCOUNTER — Ambulatory Visit
Admission: EM | Admit: 2020-09-01 | Discharge: 2020-09-01 | Disposition: A | Discharge status: Home / Self Care | Payer: PRIVATE HEALTH INSURANCE | Attending: Physician Assistant | Admitting: Physician Assistant

## 2020-09-01 DIAGNOSIS — J209 Acute bronchitis, unspecified: Secondary | ICD-10-CM | POA: Diagnosis not present

## 2020-09-01 LAB — POCT URINE PREGNANCY: Preg Test, Ur: NEGATIVE

## 2020-09-01 MED ORDER — AZITHROMYCIN 250 MG PO TABS: 250.0000 mg | ORAL_TABLET | Freq: Every day | ORAL | 0 refills | Status: AC

## 2020-09-01 MED ORDER — BENZONATATE 200 MG PO CAPS: 200.0000 mg | ORAL_CAPSULE | Freq: Three times a day (TID) | ORAL | 0 refills | Status: AC

## 2020-09-01 MED ORDER — IPRATROPIUM BROMIDE 0.06 % NA SOLN: 2.0000 | Freq: Four times a day (QID) | NASAL | 0 refills | Status: AC

## 2020-09-01 NOTE — ED Provider Notes (Signed)
[facility]    CSN: [id number] Arrival date & time: 09/01/20  1153      History   Chief Complaint Chief Complaint  Patient presents with  . Cough    x1 week  . Nasal Congestion    x1 week  . Generalized Body Aches    x1 week    HPI Jeanette Bryant is a 24 y.o. female.   24 year old female comes in for 1 week of URI symptoms. Nasal congestion, cough, body aches, chills. Short of breath at night, feels as if she is wheezing. otc cold medicine. Never smoker.      Past Medical History:  Diagnosis Date  . Medical history non-contributory     There are no problems to display for this patient.   Past Surgical History:  Procedure Laterality Date  . NO PAST SURGERIES      OB History   No obstetric history on file.      Home Medications    Prior to Admission medications   Medication Sig Start Date End Date Taking? Authorizing Provider  ibuprofen (ADVIL,MOTRIN) 600 MG tablet Take 1 tablet (600 mg total) by mouth every 6 (six) hours as needed. 08/11/16  Yes Leftwich-Kirby, Wilmer Floor, CNM  azithromycin (ZITHROMAX) 250 MG tablet Take 1 tablet (250 mg total) by mouth daily. Take first 2 tablets together, then 1 every day until finished. 09/01/20   Cathie Hoops, Brenen Beigel V, PA-C  benzonatate (TESSALON) 200 MG capsule Take 1 capsule (200 mg total) by mouth every 8 (eight) hours. 09/01/20   Cathie Hoops, Travante Knee V, PA-C  ipratropium (ATROVENT) 0.06 % nasal spray Place 2 sprays into both nostrils 4 (four) times daily. 09/01/20   Belinda Fisher, PA-C  promethazine (PHENERGAN) 25 MG tablet Take 0.5-1 tablets (12.5-25 mg total) by mouth every 6 (six) hours as needed for nausea. Patient not taking: Reported on 08/20/2016 08/11/16 09/01/20  Leftwich-Kirby, Wilmer Floor, CNM  ranitidine (ZANTAC) 150 MG capsule Take 1 capsule (150 mg total) by mouth daily. 08/20/16 09/01/20  Preston Fleeting, MD    Family History History reviewed. No pertinent family history.  Social History Social History   Tobacco Use  .  Smoking status: Never Smoker  . Smokeless tobacco: Never Used  Vaping Use  . Vaping Use: Never used  Substance Use Topics  . Alcohol use: No  . Drug use: No     Allergies   Patient has no known allergies.   Review of Systems Review of Systems  Reason unable to perform ROS: See HPI as above.     Physical Exam Triage Vital Signs ED Triage Vitals  Enc Vitals Group     BP 09/01/20 1246 116/74     Pulse Rate 09/01/20 1246 (!) 111     Resp 09/01/20 1246 20     Temp 09/01/20 1246 100.1 F (37.8 C)     Temp Source 09/01/20 1246 Oral     SpO2 09/01/20 1246 98 %     Weight --      Height --      Head Circumference --      Peak Flow --      Pain Score 09/01/20 1306 8     Pain Loc --      Pain Edu? --      Excl. in GC? --    No data found.  Updated Vital Signs BP 116/74 (BP Location: Left Arm)   Pulse (!) 111   Temp 100.1 F (37.8 C) (  Oral)   Resp 20   SpO2 98%   Physical Exam Constitutional:      General: She is not in acute distress.    Appearance: Normal appearance. She is not ill-appearing, toxic-appearing or diaphoretic.  HENT:     Head: Normocephalic and atraumatic.     Nose:     Right Sinus: Maxillary sinus tenderness and frontal sinus tenderness present.     Left Sinus: Maxillary sinus tenderness and frontal sinus tenderness present.     Mouth/Throat:     Mouth: Mucous membranes are moist.     Pharynx: Oropharynx is clear. Uvula midline.  Cardiovascular:     Rate and Rhythm: Normal rate and regular rhythm.     Heart sounds: Normal heart sounds. No murmur heard.  No friction rub. No gallop.   Pulmonary:     Effort: Pulmonary effort is normal. No accessory muscle usage, prolonged expiration, respiratory distress or retractions.     Comments: Diffuse rhonchi with some expiratory wheezing throughout.   Albuterol 2 puffs x 1: improved air movement. Improved rhonchi. Inspiratory and expiratory wheezing throughout.  Albuterol 2 puffs x 2: rhonchi resolved.  Still with some expiratory wheezing in the periphery Musculoskeletal:     Cervical back: Normal range of motion and neck supple.  Skin:    General: Skin is warm and dry.  Neurological:     General: No focal deficit present.     Mental Status: She is alert and oriented to person, place, and time.      UC Treatments / Results  Labs (all labs ordered are listed, but only abnormal results are displayed) Labs Reviewed  POCT URINE PREGNANCY    EKG   Radiology No results found.  Procedures Procedures (including critical care time)  Medications Ordered in UC Medications - No data to display  Initial Impression / Assessment and Plan / UC Course  I have reviewed the triage vital signs and the nursing notes.  Pertinent labs & imaging results that were available during my care of the patient were reviewed by me and considered in my medical decision making (see chart for details).     Azithromycin as directed. Continue albuterol as needed. Push fluids. Return precautions given.  Final Clinical Impressions(s) / UC Diagnoses   Final diagnoses:  Acute bronchitis, unspecified organism   ED Prescriptions    Medication Sig Dispense Auth. Provider   azithromycin (ZITHROMAX) 250 MG tablet Take 1 tablet (250 mg total) by mouth daily. Take first 2 tablets together, then 1 every day until finished. 6 tablet Madesyn Ast V, PA-C   benzonatate (TESSALON) 200 MG capsule Take 1 capsule (200 mg total) by mouth every 8 (eight) hours. 21 capsule Jarquavious Fentress V, PA-C   ipratropium (ATROVENT) 0.06 % nasal spray Place 2 sprays into both nostrils 4 (four) times daily. 15 mL Belinda Fisher, PA-C     PDMP not reviewed this encounter.   Belinda Fisher, PA-C 09/01/20 1844

## 2020-09-01 NOTE — ED Triage Notes (Signed)
Pt states she has had a cough, congestion, body aches for about 1 week. Pt states she had a PCR covid test on Tuesday that was negative. Pt states the symptoms have been worse today. Pt is aox4 and ambulatory.

## 2020-09-01 NOTE — Discharge Instructions (Signed)
Start azithromycin as directed. Continue albuterol 1-2 puffs every 4-6 hours as needed. Tessalon for cough. Start atrovent nasal spray for nasal congestion/drainage. You can use over the counter nasal saline rinse such as neti pot for nasal congestion. Keep hydrated, your urine should be clear to pale yellow in color. Tylenol/motrin for fever and pain. Monitor for any worsening of symptoms, chest pain, shortness of breath, wheezing, swelling of the throat, go to the emergency department for further evaluation needed.
